# Patient Record
Sex: Female | Born: 1980 | Race: Black or African American | Hispanic: No | Marital: Single | Smoking: Never smoker
Health system: Southern US, Community
[De-identification: ages and names within clinical notes are randomized; demographics above are authoritative.]

---

## 2014-07-31 ENCOUNTER — Emergency Department (HOSPITAL_COMMUNITY)
Admission: EM | Admit: 2014-07-31 | Discharge: 2014-07-31 | Disposition: A | Discharge status: Home / Self Care | Payer: Medicaid Other | Source: Home / Self Care | Attending: Family Medicine | Admitting: Family Medicine

## 2014-07-31 ENCOUNTER — Encounter (HOSPITAL_COMMUNITY): Payer: Self-pay | Admitting: Emergency Medicine

## 2014-07-31 DIAGNOSIS — N390 Urinary tract infection, site not specified: Secondary | ICD-10-CM

## 2014-07-31 LAB — POCT URINALYSIS DIP (DEVICE)
Glucose, UA: NEGATIVE mg/dL
KETONES UR: NEGATIVE mg/dL
Nitrite: POSITIVE — AB
PH: 5.5 (ref 5.0–8.0)
PROTEIN: 30 mg/dL — AB
Urobilinogen, UA: 1 mg/dL (ref 0.0–1.0)

## 2014-07-31 LAB — POCT PREGNANCY, URINE: PREG TEST UR: NEGATIVE

## 2014-07-31 MED ORDER — CEFUROXIME AXETIL 250 MG PO TABS: 250.0000 mg | ORAL_TABLET | Freq: Two times a day (BID) | ORAL | Status: AC

## 2014-07-31 NOTE — ED Provider Notes (Signed)
Andrea Erickson is a 33 y.o. female who presents to Urgent Care today for Urinary frequency urgency and pain with urination. Symptoms present for 3 days and are consistent with prior episodes of UTI. No vaginal discharge nausea vomiting or diarrhea. She's tried cranberry juice which has not helped much. Patient feels well otherwise   No past medical history on file. History  Substance Use Topics  . Smoking status: Not on file  . Smokeless tobacco: Not on file  . Alcohol Use: Not on file   ROS as above Medications: No current facility-administered medications for this encounter.   Current Outpatient Prescriptions  Medication Sig Dispense Refill  . cefUROXime (CEFTIN) 250 MG tablet Take 1 tablet (250 mg total) by mouth 2 (two) times daily with a meal.  14 tablet  0    Exam:  BP 105/67  Pulse 88  Temp(Src) 98.1 F (36.7 C) (Oral)  Resp 14  SpO2 100% Gen: Well NAD HEENT: EOMI,  MMM Lungs: Normal work of breathing. CTABL Heart: RRR no MRG Abd: NABS, Soft. Nondistended, Non tender no CV angle tenderness to percussion Exts: Brisk capillary refill, warm and well perfused.   Results for orders placed during the hospital encounter of 07/31/14 (from the past 24 hour(s))  POCT URINALYSIS DIP (DEVICE)     Status: Abnormal   Collection Time    07/31/14 11:43 AM      Result Value Ref Range   Glucose, UA NEGATIVE  NEGATIVE mg/dL   Bilirubin Urine SMALL (*) NEGATIVE   Ketones, ur NEGATIVE  NEGATIVE mg/dL   Specific Gravity, Urine >=1.030  1.005 - 1.030   Hgb urine dipstick TRACE (*) NEGATIVE   pH 5.5  5.0 - 8.0   Protein, ur 30 (*) NEGATIVE mg/dL   Urobilinogen, UA 1.0  0.0 - 1.0 mg/dL   Nitrite POSITIVE (*) NEGATIVE   Leukocytes, UA SMALL (*) NEGATIVE  POCT PREGNANCY, URINE     Status: None   Collection Time    07/31/14 11:44 AM      Result Value Ref Range   Preg Test, Ur NEGATIVE  NEGATIVE   No results found.  Assessment and Plan: 33 y.o. female with urinary tract infection.  Plan to treat with Ceftin  Discussed warning signs or symptoms. Please see discharge instructions. Patient expresses understanding.   This note was created using Conservation officer, historic buildings. Any transcription errors are unintended.    Rodolph Bong, MD 07/31/14 [phone]

## 2014-07-31 NOTE — ED Notes (Signed)
Pt       Reports     Symptoms  Of     Discomfort  On  Urination  Described  As   Sharp  Stinging  Pain     With  Symptoms  X  3  Days            Pt       denys  Any  Vaginal  Discharge       Symptoms  Not  Relived  By  Home  Remedies

## 2014-07-31 NOTE — Discharge Instructions (Signed)
Thank you for coming in today. If your belly pain worsens, or you have high fever, bad vomiting, blood in your stool or black tarry stool go to the Emergency Room.  Take ceftin twice daily for 1 week.  Come back as needed.   Urinary Tract Infection Urinary tract infections (UTIs) can develop anywhere along your urinary tract. Your urinary tract is your body's drainage system for removing wastes and extra water. Your urinary tract includes two kidneys, two ureters, a bladder, and a urethra. Your kidneys are a pair of bean-shaped organs. Each kidney is about the size of your fist. They are located below your ribs, one on each side of your spine. CAUSES Infections are caused by microbes, which are microscopic organisms, including fungi, viruses, and bacteria. These organisms are so small that they can only be seen through a microscope. Bacteria are the microbes that most commonly cause UTIs. SYMPTOMS  Symptoms of UTIs may vary by age and gender of the patient and by the location of the infection. Symptoms in young women typically include a frequent and intense urge to urinate and a painful, burning feeling in the bladder or urethra during urination. Older women and men are more likely to be tired, shaky, and weak and have muscle aches and abdominal pain. A fever may mean the infection is in your kidneys. Other symptoms of a kidney infection include pain in your back or sides below the ribs, nausea, and vomiting. DIAGNOSIS To diagnose a UTI, your caregiver will ask you about your symptoms. Your caregiver also will ask to provide a urine sample. The urine sample will be tested for bacteria and white blood cells. White blood cells are made by your body to help fight infection. TREATMENT  Typically, UTIs can be treated with medication. Because most UTIs are caused by a bacterial infection, they usually can be treated with the use of antibiotics. The choice of antibiotic and length of treatment depend on your  symptoms and the type of bacteria causing your infection. HOME CARE INSTRUCTIONS  If you were prescribed antibiotics, take them exactly as your caregiver instructs you. Finish the medication even if you feel better after you have only taken some of the medication.  Drink enough water and fluids to keep your urine clear or pale yellow.  Avoid caffeine, tea, and carbonated beverages. They tend to irritate your bladder.  Empty your bladder often. Avoid holding urine for long periods of time.  Empty your bladder before and after sexual intercourse.  After a bowel movement, women should cleanse from front to back. Use each tissue only once. SEEK MEDICAL CARE IF:   You have back pain.  You develop a fever.  Your symptoms do not begin to resolve within 3 days. SEEK IMMEDIATE MEDICAL CARE IF:   You have severe back pain or lower abdominal pain.  You develop chills.  You have nausea or vomiting.  You have continued burning or discomfort with urination. MAKE SURE YOU:   Understand these instructions.  Will watch your condition.  Will get help right away if you are not doing well or get worse. Document Released: 08/24/2005 Document Revised: 05/15/2012 Document Reviewed: 12/23/2011 [facility] Patient Information 2015 [place], [place]. This information is not intended to replace advice given to you by your health care provider. Make sure you discuss any questions you have with your health care provider.

## 2014-09-24 ENCOUNTER — Emergency Department (HOSPITAL_COMMUNITY)
Admission: EM | Admit: 2014-09-24 | Discharge: 2014-09-24 | Disposition: A | Discharge status: Home / Self Care | Payer: Medicaid Other | Attending: Emergency Medicine | Admitting: Emergency Medicine

## 2014-09-24 ENCOUNTER — Encounter (HOSPITAL_COMMUNITY): Payer: Self-pay | Admitting: Emergency Medicine

## 2014-09-24 DIAGNOSIS — H9203 Otalgia, bilateral: Secondary | ICD-10-CM

## 2014-09-24 DIAGNOSIS — H6993 Unspecified Eustachian tube disorder, bilateral: Secondary | ICD-10-CM | POA: Insufficient documentation

## 2014-09-24 DIAGNOSIS — Z792 Long term (current) use of antibiotics: Secondary | ICD-10-CM | POA: Insufficient documentation

## 2014-09-24 DIAGNOSIS — H6983 Other specified disorders of Eustachian tube, bilateral: Secondary | ICD-10-CM

## 2014-09-24 MED ORDER — ANTIPYRINE-BENZOCAINE 5.4-1.4 % OT SOLN: 3.0000 [drp] | OTIC | Status: AC | PRN

## 2014-09-24 NOTE — ED Notes (Signed)
Pt c/o bilateral otalgia x 4 days.

## 2014-09-24 NOTE — ED Provider Notes (Signed)
Medical screening examination/treatment/procedure(s) were performed by non-physician practitioner and as supervising physician I was immediately available for consultation/collaboration.   EKG Interpretation None        Sonoma Firkus N Keiosha Cancro, DO 09/24/14 1611 

## 2014-09-24 NOTE — Discharge Instructions (Signed)
Otalgia °The most common reason for this in children is an infection of the middle ear. Pain from the middle ear is usually caused by a build-up of fluid and pressure behind the eardrum. Pain from an earache can be sharp, dull, or burning. The pain may be temporary or constant. The middle ear is connected to the nasal passages by a short narrow tube called the Eustachian tube. The Eustachian tube allows fluid to drain out of the middle ear, and helps keep the pressure in your ear equalized. °CAUSES  °A cold or allergy can block the Eustachian tube with inflammation and the build-up of secretions. This is especially likely in small children, because their Eustachian tube is shorter and more horizontal. When the Eustachian tube closes, the normal flow of fluid from the middle ear is stopped. Fluid can accumulate and cause stuffiness, pain, hearing loss, and an ear infection if germs start growing in this area. °SYMPTOMS  °The symptoms of an ear infection may include fever, ear pain, fussiness, increased crying, and irritability. Many children will have temporary and minor hearing loss during and right after an ear infection. Permanent hearing loss is rare, but the risk increases the more infections a child has. Other causes of ear pain include retained water in the outer ear canal from swimming and bathing. °Ear pain in adults is less likely to be from an ear infection. Ear pain may be referred from other locations. Referred pain may be from the joint between your jaw and the skull. It may also come from a tooth problem or problems in the neck. Other causes of ear pain include: °· A foreign body in the ear. °· Outer ear infection. °· Sinus infections. °· Impacted ear wax. °· Ear injury. °· Arthritis of the jaw or TMJ problems. °· Middle ear infection. °· Tooth infections. °· Sore throat with pain to the ears. °DIAGNOSIS  °Your caregiver can usually make the diagnosis by examining you. Sometimes other special studies,  including x-rays and lab work may be necessary. °TREATMENT  °· If antibiotics were prescribed, use them as directed and finish them even if you or your child's symptoms seem to be improved. °· Sometimes PE tubes are needed in children. These are little plastic tubes which are put into the eardrum during a simple surgical procedure. They allow fluid to drain easier and allow the pressure in the middle ear to equalize. This helps relieve the ear pain caused by pressure changes. °HOME CARE INSTRUCTIONS  °· Only take over-the-counter or prescription medicines for pain, discomfort, or fever as directed by your caregiver. DO NOT GIVE CHILDREN ASPIRIN because of the association of Reye's Syndrome in children taking aspirin. °· Use a cold pack applied to the outer ear for 15-20 minutes, 03-04 times per day or as needed may reduce pain. Do not apply ice directly to the skin. You may cause frost bite. °· Over-the-counter ear drops used as directed may be effective. Your caregiver may sometimes prescribe ear drops. °· Resting in an upright position may help reduce pressure in the middle ear and relieve pain. °· Ear pain caused by rapidly descending from high altitudes can be relieved by swallowing or chewing gum. Allowing infants to suck on a bottle during airplane travel can help. °· Do not smoke in the house or near children. If you are unable to quit smoking, smoke outside. °· Control allergies. °SEEK IMMEDIATE MEDICAL CARE IF:  °· You or your child are becoming sicker. °· Pain or fever   relief is not obtained with medicine.  You or your child's symptoms (pain, fever, or irritability) do not improve within 24 to 48 hours or as instructed.  Severe pain suddenly stops hurting. This may indicate a ruptured eardrum.  You or your children develop new problems such as severe headaches, stiff neck, difficulty swallowing, or swelling of the face or around the ear. Document Released: 07/01/2004 Document Revised: 02/06/2012  Document Reviewed: 11/05/2008 [facility] Patient Information 2015 [place], [place]. This information is not intended to replace advice given to you by your health care provider. Make sure you discuss any questions you have with your health care provider. Barotitis Media Barotitis media is inflammation of your middle ear. This occurs when the auditory tube (eustachian tube) leading from the back of your nose (nasopharynx) to your eardrum is blocked. This blockage may result from a cold, environmental allergies, or an upper respiratory infection. Unresolved barotitis media may lead to damage or hearing loss (barotrauma), which may become permanent. HOME CARE INSTRUCTIONS   Use medicines as recommended by your health care provider. Over-the-counter medicines will help unblock the canal and can help during times of air travel.  Do not put anything into your ears to clean or unplug them. Eardrops will not be helpful.  Do not swim, dive, or fly until your health care provider says it is all right to do so. If these activities are necessary, chewing gum with frequent, forceful swallowing may help. It is also helpful to hold your nose and gently blow to pop your ears for equalizing pressure changes. This forces air into the eustachian tube.  Only take over-the-counter or prescription medicines for pain, discomfort, or fever as directed by your health care provider.  A decongestant may be helpful in decongesting the middle ear and make pressure equalization easier. SEEK MEDICAL CARE IF:  You experience a serious form of dizziness in which you feel as if the room is spinning and you feel nauseated (vertigo).  Your symptoms only involve one ear. SEEK IMMEDIATE MEDICAL CARE IF:   You develop a severe headache, dizziness, or severe ear pain.  You have bloody or pus-like drainage from your ears.  You develop a fever.  Your problems do not improve or become worse. MAKE SURE YOU:   Understand these  instructions.  Will watch your condition.  Will get help right away if you are not doing well or get worse. Document Released: 11/11/2000 Document Revised: 09/04/2013 Document Reviewed: 06/11/2013 [facility] Patient Information 2015 [place], [place]. This information is not intended to replace advice given to you by your health care provider. Make sure you discuss any questions you have with your health care provider.

## 2014-09-24 NOTE — ED Provider Notes (Signed)
CSN: [id number]     Arrival date & time 09/24/14  1217 History  This chart was scribed for non-physician practitioner, Arthor CaptainAbigail Joanell Cressler, PA-C,working with Layla MawKristen N Ward, DO, by Karle PlumberJennifer Tensley, ED Scribe. This patient was seen in room WTR9/WTR9 and the patient's care was started at 1:21 PM.  Chief Complaint  Patient presents with  . Otalgia   Patient is a 33 y.o. female presenting with ear pain. The history is provided by the patient. No language interpreter was used.  Otalgia Associated symptoms: ear discharge   Associated symptoms: no congestion, no cough, no fever and no sore throat    HPI Comments:  Andrea Kernemikta Erickson is a 33 y.o. female who presents to the Emergency Department complaining of moderate bilateral otalgia that began three days ago. She reports the pain is located in the outer part of her ears. Pt reports feeling as if she has ear drainage and itching. She has not tried any treatment for her symptoms. Cold air and cold liquids makes the pain worse. Denies recent travel. Denies fever, chills, sore throat, congestion, cough, CP, SOB or any recent illnesses or seasonal allergies.   History reviewed. No pertinent past medical history. No past surgical history on file. No family history on file. History  Substance Use Topics  . Smoking status: Never Smoker   . Smokeless tobacco: Not on file  . Alcohol Use: Yes   OB History   Grav Para Term Preterm Abortions TAB SAB Ect Mult Living                 Review of Systems  Constitutional: Negative for fever and chills.  HENT: Positive for ear discharge and ear pain. Negative for congestion and sore throat.   Respiratory: Negative for cough and shortness of breath.   Cardiovascular: Negative for chest pain.    Allergies  Review of patient's allergies indicates no known allergies.  Home Medications   Prior to Admission medications   Medication Sig Start Date End Date Taking? Authorizing Provider  cefUROXime (CEFTIN) 250 MG  tablet Take 1 tablet (250 mg total) by mouth 2 (two) times daily with a meal. 07/31/14   Rodolph BongEvan S Corey, MD   Triage Vitals: BP 118/76  Pulse 100  Temp(Src) 98 F (36.7 C) (Oral)  Resp 18  SpO2 100% Physical Exam  Nursing note and vitals reviewed. Constitutional: She is oriented to person, place, and time. She appears well-developed and well-nourished.  HENT:  Head: Normocephalic and atraumatic.  Right Ear: Tympanic membrane and ear canal normal.  Left Ear: Tympanic membrane and ear canal normal.  Mouth/Throat: Uvula is midline, oropharynx is clear and moist and mucous membranes are normal.  Eyes: EOM are normal.  Neck: Normal range of motion.  Cardiovascular: Normal rate, regular rhythm and normal heart sounds.  Exam reveals no gallop and no friction rub.   No murmur heard. Pulmonary/Chest: Effort normal and breath sounds normal. No respiratory distress. She has no wheezes. She has no rales.  Musculoskeletal: Normal range of motion.  Lymphadenopathy:    She has no cervical adenopathy.  Neurological: She is alert and oriented to person, place, and time.  Skin: Skin is warm and dry.  Psychiatric: She has a normal mood and affect. Her behavior is normal.    ED Course  Procedures (including critical care time) DIAGNOSTIC STUDIES: Oxygen Saturation is 100% on RA, normal by my interpretation.   COORDINATION OF CARE: 1:25 PM- Advised pt to take OTC Tylenol or Advil. Will prescribe  Auralgan otic drops. Informed pt to follow up with ENT if symptoms persist. Pt verbalizes understanding and agrees to plan.  Medications - No data to display  Labs Review Labs Reviewed - No data to display  Imaging Review No results found.   EKG Interpretation None      MDM   Final diagnoses:  Otalgia, bilateral  Eustachian tube dysfunction, bilateral    Patient without obvious signs of otitis externa or media. She is no auricular lymphadenopathy or mastoid tenderness. She does have congestion  and improve her pain is related to eustachian tube dysfunction. Follow-up with ear nose and throat. I personally performed the services described in this documentation, which was scribed in my presence. The recorded information has been reviewed and is accurate.    Arthor CaptainAbigail Lacrystal Barbe, PA-C 09/24/14 [phone]

## 2014-10-16 ENCOUNTER — Emergency Department (HOSPITAL_COMMUNITY): Payer: Medicaid Other

## 2014-10-16 ENCOUNTER — Emergency Department (HOSPITAL_COMMUNITY)
Admission: EM | Admit: 2014-10-16 | Discharge: 2014-10-16 | Disposition: A | Discharge status: Home / Self Care | Payer: Medicaid Other | Attending: Emergency Medicine | Admitting: Emergency Medicine

## 2014-10-16 ENCOUNTER — Encounter (HOSPITAL_COMMUNITY): Payer: Self-pay | Admitting: *Deleted

## 2014-10-16 DIAGNOSIS — R0789 Other chest pain: Secondary | ICD-10-CM | POA: Diagnosis not present

## 2014-10-16 DIAGNOSIS — Z792 Long term (current) use of antibiotics: Secondary | ICD-10-CM | POA: Diagnosis not present

## 2014-10-16 DIAGNOSIS — R079 Chest pain, unspecified: Secondary | ICD-10-CM | POA: Diagnosis present

## 2014-10-16 LAB — CBC
HEMATOCRIT: 38.4 % (ref 36.0–46.0)
HEMOGLOBIN: 12.1 g/dL (ref 12.0–15.0)
MCH: 24.9 pg — AB (ref 26.0–34.0)
MCHC: 31.5 g/dL (ref 30.0–36.0)
MCV: 79 fL (ref 78.0–100.0)
Platelets: 266 10*3/uL (ref 150–400)
RBC: 4.86 MIL/uL (ref 3.87–5.11)
RDW: 15.1 % (ref 11.5–15.5)
WBC: 7.6 10*3/uL (ref 4.0–10.5)

## 2014-10-16 LAB — BASIC METABOLIC PANEL
Anion gap: 15 (ref 5–15)
BUN: 7 mg/dL (ref 6–23)
CO2: 23 mEq/L (ref 19–32)
CREATININE: 0.52 mg/dL (ref 0.50–1.10)
Calcium: 9.9 mg/dL (ref 8.4–10.5)
Chloride: 103 mEq/L (ref 96–112)
GFR calc Af Amer: >90 mL/min (ref >90)
GLUCOSE: 129 mg/dL — AB (ref 70–99)
Potassium: 4 mEq/L (ref 3.7–5.3)
Sodium: 141 mEq/L (ref 137–147)

## 2014-10-16 LAB — TROPONIN I: Troponin I: <0.3 ng/mL (ref <0.30)

## 2014-10-16 MED ORDER — HYDROCODONE-ACETAMINOPHEN 5-325 MG PO TABS: 1.0000 | ORAL_TABLET | ORAL | Status: AC | PRN

## 2014-10-16 MED ORDER — IBUPROFEN 800 MG PO TABS
800.0000 mg | ORAL_TABLET | Freq: Once | ORAL | Status: AC
Start: 2014-10-16 — End: 2014-10-16
  Administered 2014-10-16: 800 mg via ORAL
  Filled 2014-10-16: qty 1

## 2014-10-16 MED ORDER — IBUPROFEN 800 MG PO TABS: 800.0000 mg | ORAL_TABLET | Freq: Three times a day (TID) | ORAL | Status: AC | PRN

## 2014-10-16 MED ORDER — HYDROCODONE-ACETAMINOPHEN 5-325 MG PO TABS
2.0000 | ORAL_TABLET | Freq: Once | ORAL | Status: AC
  Administered 2014-10-16: 2 via ORAL
  Filled 2014-10-16: qty 2

## 2014-10-16 NOTE — Discharge Instructions (Signed)

## 2014-10-16 NOTE — ED Provider Notes (Signed)
TIME SEEN: 3:25 PM  CHIEF COMPLAINT: Chest pain  HPI: Pt is a 33 y.o. F with no significant past medical history who presents to the emergency department with central chest pain that she described as a "pulling" there is worse when she tried to lift her 33-year-old child up this morning. States the pain woke her from sleep around 1:30 AM and has been constant since. Denies shortness of breath, nausea or vomiting, diaphoresis or dizziness. She denies a history of hypertension, diabetes, hyperlipidemia or tobacco use. No family history of premature CAD. No history of recent prolonged immobilization such as long flight or hospitalization, fracture, surgery, trauma, exogenous hormone use. No lower extremity swelling or pain. No fevers or cough. No injury to her chest.  ROS: See HPI Constitutional: no fever  Eyes: no drainage  ENT: no runny nose   Cardiovascular:  chest pain  Resp: no SOB  GI: no vomiting GU: no dysuria Integumentary: no rash  Allergy: no hives  Musculoskeletal: no leg swelling  Neurological: no slurred speech ROS otherwise negative  PAST MEDICAL HISTORY/PAST SURGICAL HISTORY:  History reviewed. No pertinent past medical history.  MEDICATIONS:  Prior to Admission medications   Medication Sig Start Date End Date Taking? Authorizing Provider  antipyrine-benzocaine Lyla Son(AURALGAN) otic solution Place 3 drops into the right ear every 2 (two) hours as needed for ear pain. 09/24/14   Arthor CaptainAbigail Harris, PA-C  cefUROXime (CEFTIN) 250 MG tablet Take 1 tablet (250 mg total) by mouth 2 (two) times daily with a meal. 07/31/14   Rodolph BongEvan S Corey, MD    ALLERGIES:  No Known Allergies  SOCIAL HISTORY:  History  Substance Use Topics  . Smoking status: Never Smoker   . Smokeless tobacco: Not on file  . Alcohol Use: Yes    FAMILY HISTORY: No family history on file.  EXAM: BP 118/71 mmHg  Pulse 94  Temp(Src) 98.7 F (37.1 C) (Oral)  Resp 18  Ht 5\' 5"  (1.651 m)  Wt 181 lb (82.101 kg)   BMI 30.12 kg/m2  SpO2 99%  LMP 10/03/2014 CONSTITUTIONAL: Alert and oriented and responds appropriately to questions. Well-appearing; well-nourished HEAD: Normocephalic EYES: Conjunctivae clear, PERRL ENT: normal nose; no rhinorrhea; moist mucous membranes; pharynx without lesions noted NECK: Supple, no meningismus, no LAD  CARD: RRR; S1 and S2 appreciated; no murmurs, no clicks, no rubs, no gallops CHEST:  Tender to palpation over the sternum which reproduces her pain, no crepitus or ecchymosis or deformity, no lesions noted on the chest wall RESP: Normal chest excursion without splinting or tachypnea; breath sounds clear and equal bilaterally; no wheezes, no rhonchi, no rales, no hypoxia or respiratory distress ABD/GI: Normal bowel sounds; non-distended; soft, non-tender, no rebound, no guarding BACK:  The back appears normal and is non-tender to palpation, there is no CVA tenderness EXT: Normal ROM in all joints; non-tender to palpation; no edema; normal capillary refill; no cyanosis    SKIN: Normal color for age and race; warm NEURO: Moves all extremities equally PSYCH: The patient's mood and manner are appropriate. Grooming and personal hygiene are appropriate.  MEDICAL DECISION MAKING: Pt here with what appears to be chest wall pain. She is hemodynamically stable. She has no risk factors for ACS or pulmonary embolus. She is PERC negative with HEART score of 0.  Her pain is reproducible with palpation of her chest wall. Will give ibuprofen, Vicodin. I feel she is safe to be discharged home. Discussed return precautions and supportive care instructions. Given pain is been  constant since 2 AM and she is very low risk and she has negative labs, EKG and chest x-ray after she is safe to be discharged. Patient verbalizes understanding is comfortable with plan. I do not feel she needs a second set of cardiac enzymes.       EKG Interpretation  Date/Time:  Thursday October 16 2014 10:36:09  EST Ventricular Rate:  106 PR Interval:  162 QRS Duration: 90 QT Interval:  332 QTC Calculation: 441 R Axis:   70 Text Interpretation:  Sinus tachycardia Otherwise normal ECG Confirmed by Keelynn Furgerson,  DO, Avishai Reihl ([id number]) on 10/16/2014 3:05:37 PM         Layla MawKristen N Lexie Koehl, DO 10/16/14 1537

## 2014-10-16 NOTE — ED Notes (Signed)
Pt c/o acute onset sternal chest pain that increases with movement of arms and neck since 2 am.  No sob or nausea.

## 2015-04-13 IMAGING — CR DG CHEST 2V
2 series · 2 of 2 positions shown · non-contrast
Comparison: None.

CLINICAL DATA: Onset of left-sided chest pain at 2 a.m. today ;
history of panic attacks

EXAM:
CHEST  2 VIEW

[w chest pa]
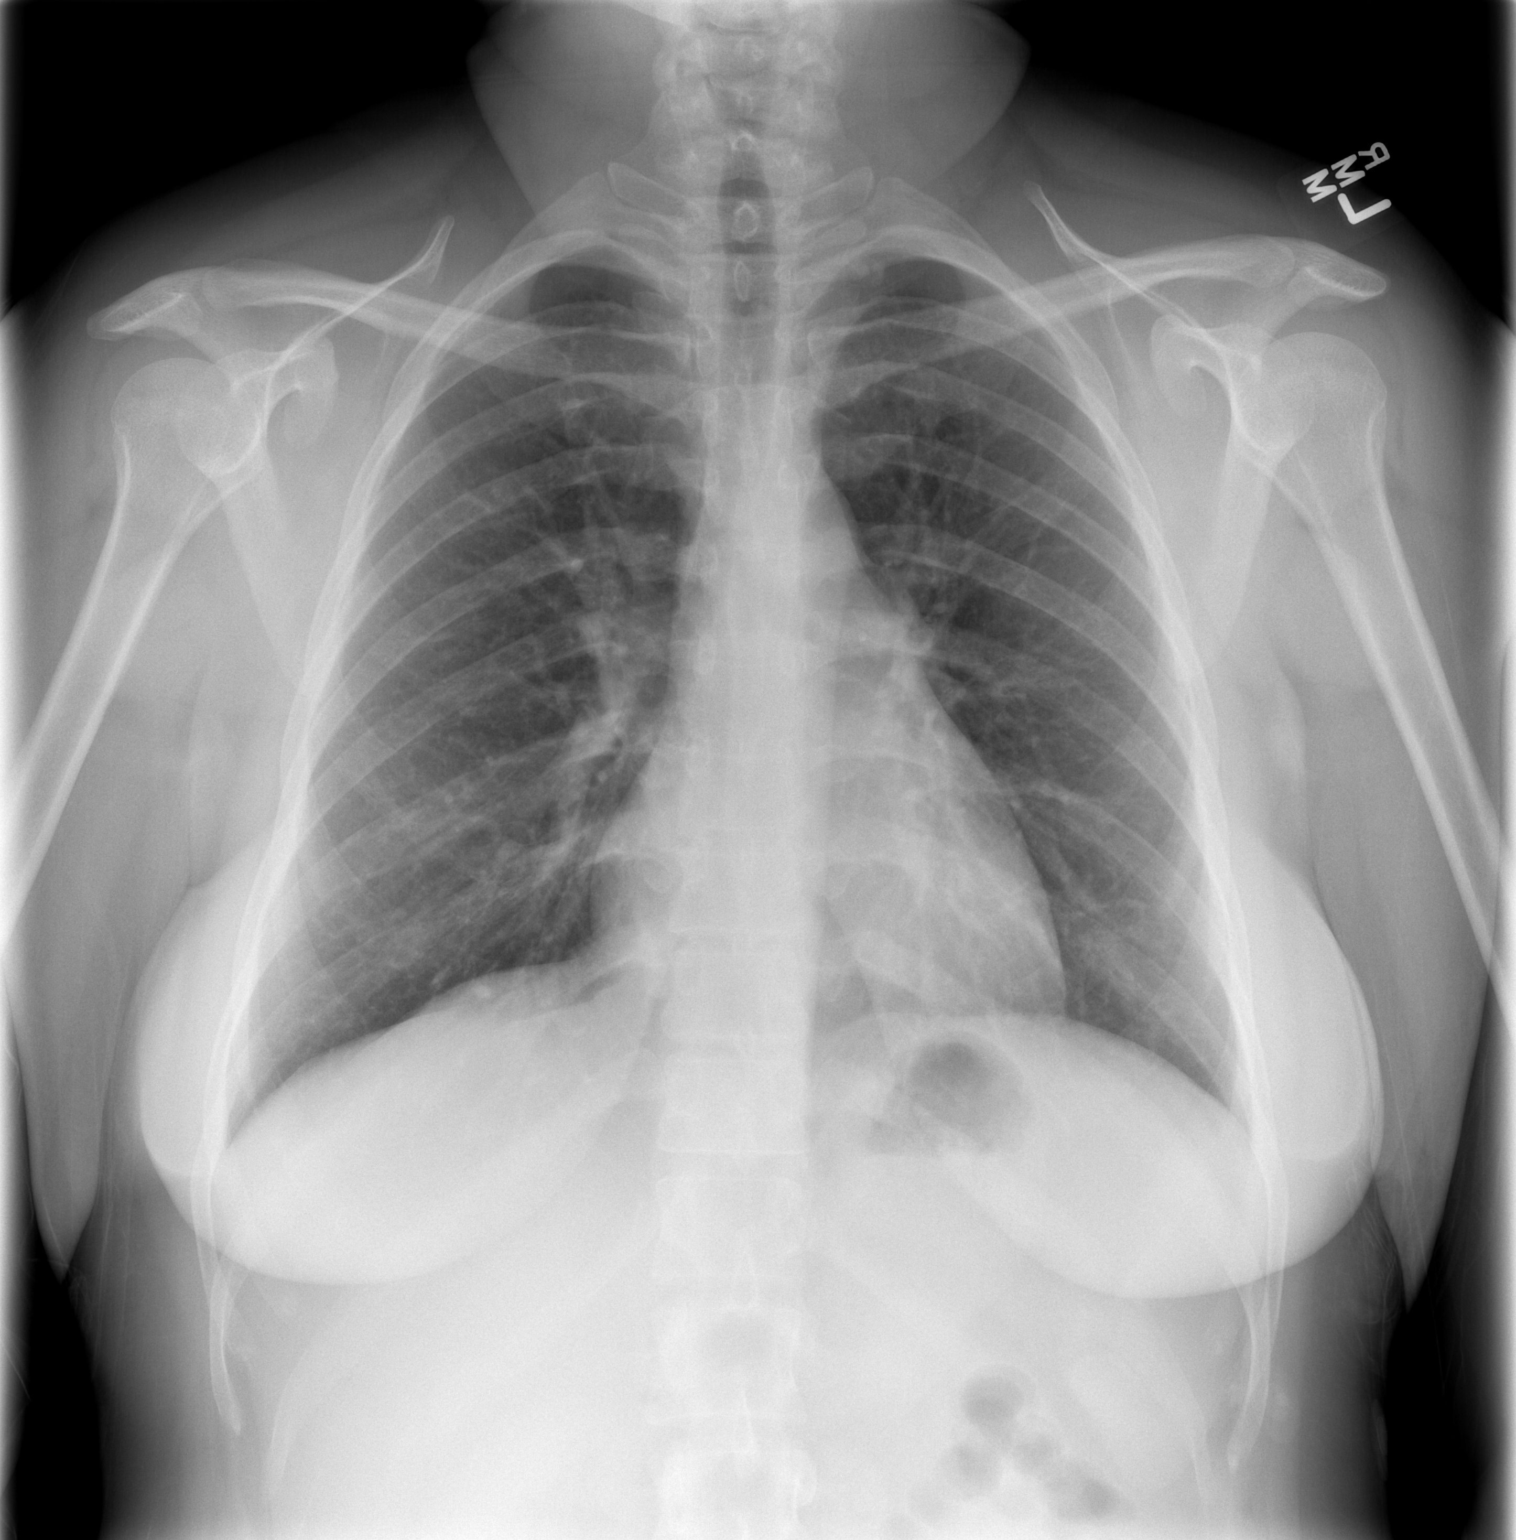

[w chest lat]
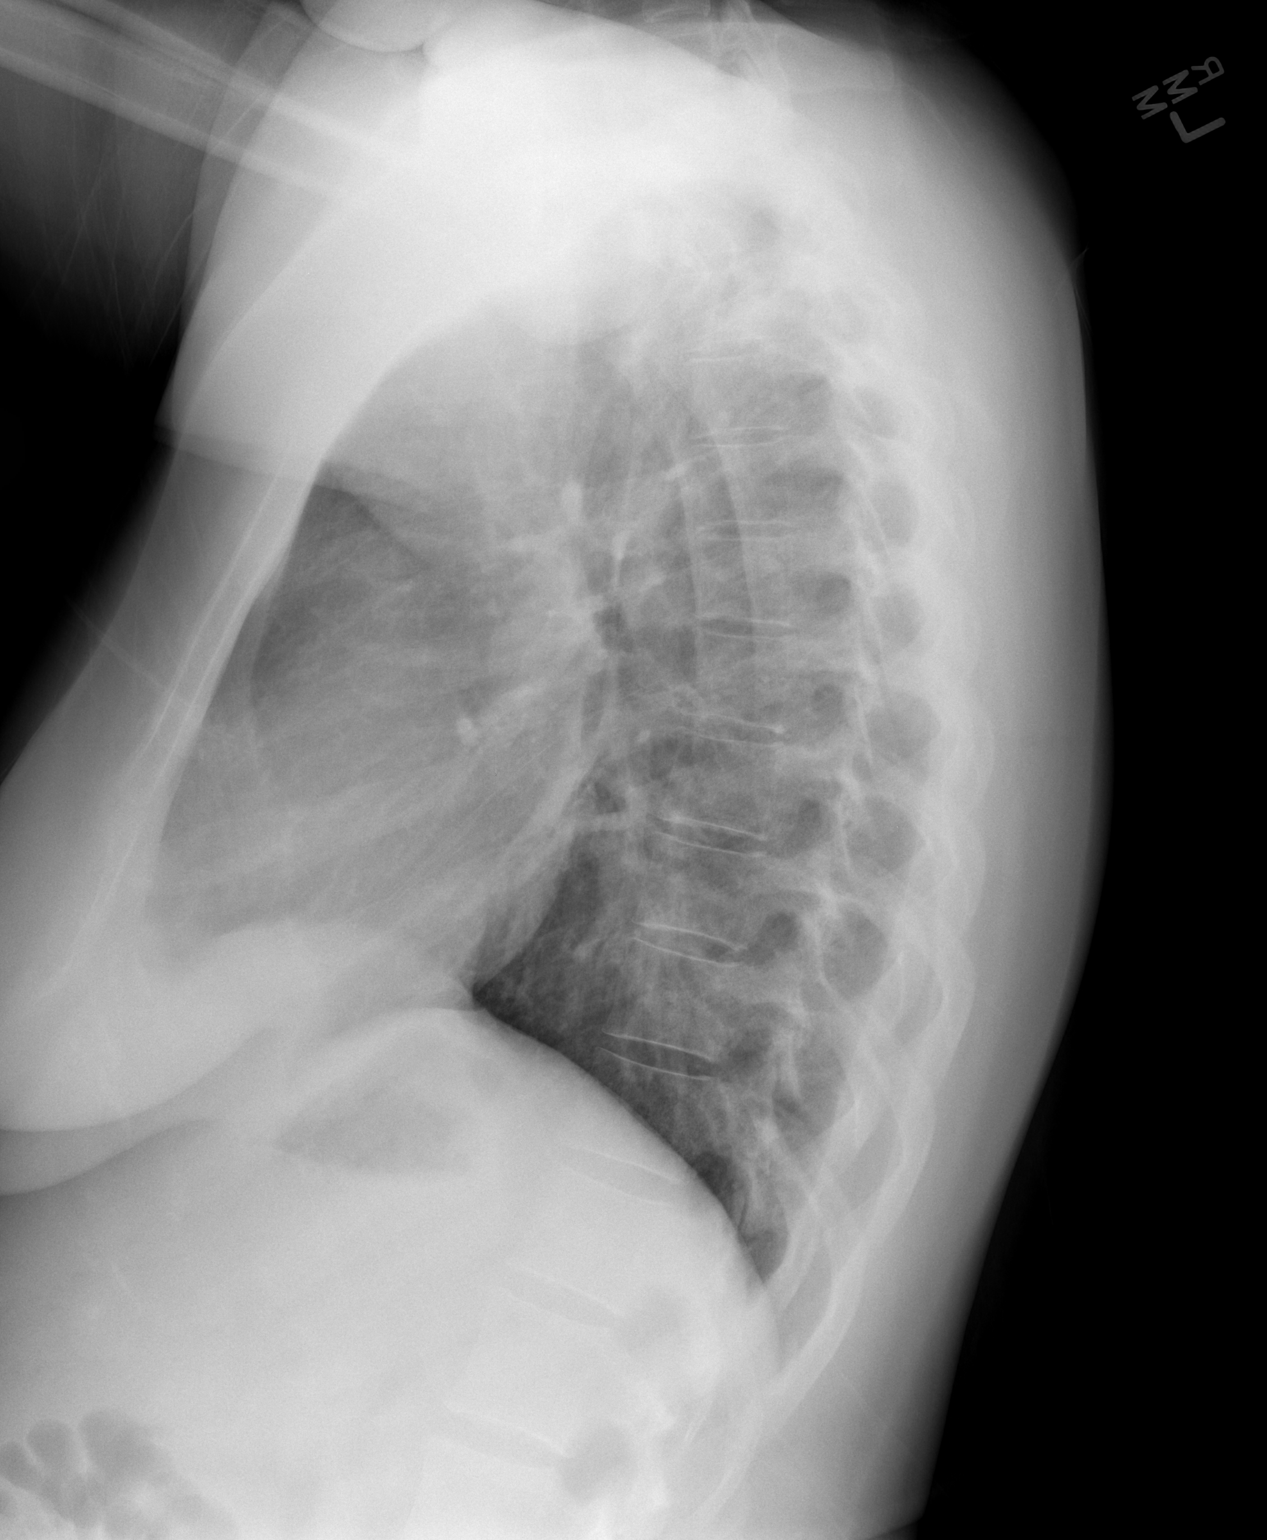

[2 of 2 positions shown; findings below may reference images not displayed]

FINDINGS: The lungs are adequately inflated and clear. The heart and
mediastinal structures are normal. The trachea is midline. There is
no pleural effusion or pneumothorax. The bony thorax is
unremarkable.
IMPRESSION: There is no acute cardiopulmonary abnormality.
